# Patient Record
Sex: Female | Born: 1995 | Race: Black or African American | Hispanic: No | Marital: Single | Smoking: Never smoker
Health system: Southern US, Community
[De-identification: ages and names within clinical notes are randomized; demographics above are authoritative.]

## PROBLEM LIST (undated history)

## (undated) HISTORY — PX: ADENOIDECTOMY: SUR15

## (undated) HISTORY — PX: TONSILLECTOMY: SUR1361

---

## 2017-11-15 ENCOUNTER — Emergency Department (INDEPENDENT_AMBULATORY_CARE_PROVIDER_SITE_OTHER): Payer: Self-pay

## 2017-11-15 ENCOUNTER — Encounter: Payer: Self-pay | Admitting: *Deleted

## 2017-11-15 ENCOUNTER — Other Ambulatory Visit: Payer: Self-pay

## 2017-11-15 ENCOUNTER — Emergency Department (INDEPENDENT_AMBULATORY_CARE_PROVIDER_SITE_OTHER)
Admission: EM | Admit: 2017-11-15 | Discharge: 2017-11-15 | Disposition: A | Discharge status: Home / Self Care | Payer: Self-pay | Source: Home / Self Care | Attending: Family Medicine | Admitting: Family Medicine

## 2017-11-15 DIAGNOSIS — M79641 Pain in right hand: Secondary | ICD-10-CM

## 2017-11-15 DIAGNOSIS — M25531 Pain in right wrist: Secondary | ICD-10-CM

## 2017-11-15 DIAGNOSIS — S6391XA Sprain of unspecified part of right wrist and hand, initial encounter: Secondary | ICD-10-CM

## 2017-11-15 DIAGNOSIS — S63501A Unspecified sprain of right wrist, initial encounter: Secondary | ICD-10-CM

## 2017-11-15 NOTE — Discharge Instructions (Signed)
°  You may take 500mg  acetaminophen every 4-6 hours or in combination with ibuprofen 400-600mg  every 6-8 hours as needed for pain and inflammation.  Please [facility] if not improving by the end of the week.

## 2017-11-15 NOTE — ED Triage Notes (Signed)
Pt c/o RT pain after lifting a heavy bag at work at 1700 today. No OTC meds.

## 2017-11-15 NOTE — ED Provider Notes (Signed)
[facility]    CSN: [id number] Arrival date & time: 11/15/17  1844     History   Chief Complaint Chief Complaint  Patient presents with  . Wrist Pain    HPI Tara Goodman is a 22 y.o. female.   HPI  Tara Goodman is a 22 y.o. female presenting to UC with c/o Right hand and wrist pain that started around 5PM today while at work. Pt wrapping a heavy bag, she believes weighs about 50 pounds, and pulled up on it but immediately felt pain in her hand and wrist.  She wrapped her hand and wrist with athletic tape but no relief.  Pain is aching and sore, worse with movement and tender to the ulnar side.  She is Right hand dominant. No pain medication taken PTA. No prior injury or surgery to same hand or wrist.    History reviewed. No pertinent past medical history.  There are no active problems to display for this patient.   Past Surgical History:  Procedure Laterality Date  . ADENOIDECTOMY    . TONSILLECTOMY      OB History   None      Home Medications    Prior to Admission medications   Not on File    Family History Family History  Problem Relation Age of Onset  . Diabetes Mother   . Hypertension Mother   . Hypertension Father   . Breast cancer Maternal Grandmother     Social History Social History   Tobacco Use  . Smoking status: Never Smoker  . Smokeless tobacco: Never Used  Substance Use Topics  . Alcohol use: Never    Frequency: Never  . Drug use: Never     Allergies   Patient has no known allergies.   Review of Systems Review of Systems  Musculoskeletal: Positive for arthralgias and joint swelling. Negative for myalgias.  Skin: Negative for color change and wound.  Neurological: Positive for weakness (Right hand due to pain). Negative for numbness.     Physical Exam Triage Vital Signs ED Triage Vitals  Enc Vitals Group     BP 11/15/17 1901 122/72     Pulse Rate 11/15/17 1901 87     Resp 11/15/17 1901 16     Temp  11/15/17 1901 98.5 F (36.9 C)     Temp Source 11/15/17 1901 Oral     SpO2 11/15/17 1901 99 %     Weight 11/15/17 1902 174 lb (78.9 kg)     Height 11/15/17 1902 5\' 7"  (1.702 m)     Head Circumference --      Peak Flow --      Pain Score 11/15/17 1902 5     Pain Loc --      Pain Edu? --      Excl. in GC? --    No data found.  Updated Vital Signs BP 122/72 (BP Location: Right Arm)   Pulse 87   Temp 98.5 F (36.9 C) (Oral)   Resp 16   Ht 5\' 7"  (1.702 m)   Wt 174 lb (78.9 kg)   LMP 10/06/2017   SpO2 99%   BMI 27.25 kg/m   Visual Acuity Right Eye Distance:   Left Eye Distance:   Bilateral Distance:    Right Eye Near:   Left Eye Near:    Bilateral Near:     Physical Exam  Constitutional: She is oriented to person, place, and time. She appears well-developed and well-nourished.  HENT:  Head: Normocephalic and atraumatic.  Eyes: EOM are normal.  Neck: Normal range of motion.  Cardiovascular: Normal rate.  Pulses:      Radial pulses are 2+ on the right side.  Pulmonary/Chest: Effort normal.  Musculoskeletal: She exhibits edema and tenderness.  Right wrist and hand: tenderness along ulnar aspect. Mild edema over 5th metacarpal. Tenderness to light touch over 4th and 5th metacarpals and MCP joints. Tenderness to little finger. 4/5 grip strength compared to Left.  Limited flexion and extension of wrist. Right elbow: full ROM, non-tender.  Neurological: She is alert and oriented to person, place, and time.  Skin: Skin is warm and dry. Capillary refill takes less than 2 seconds.  Right arm: skin in tact. No ecchymosis or erythema.   Psychiatric: She has a normal mood and affect. Her behavior is normal.  Nursing note and vitals reviewed.    UC Treatments / Results  Labs (all labs ordered are listed, but only abnormal results are displayed) Labs Reviewed - No data to display  EKG None  Radiology Dg Wrist Complete Right  Result Date: 11/15/2017 CLINICAL DATA:   Right wrist pain after lifting bags at work. EXAM: RIGHT WRIST - COMPLETE 3+ VIEW COMPARISON:  None. FINDINGS: There is no evidence of fracture or dislocation. There is no evidence of arthropathy or other focal bone abnormality. Soft tissues are unremarkable. Dedicated view of the scaphoid is unremarkable. IMPRESSION: No acute osseous abnormality of the right wrist. Intact carpal bones and carpal rows. Electronically Signed   By: Tollie Eth M.D.   On: 11/15/2017 19:25   Dg Hand Complete Right  Result Date: 11/15/2017 CLINICAL DATA:  Right hand pain after lifting bags at work. Burning sensation radiating to the arm. EXAM: RIGHT HAND - COMPLETE 3+ VIEW COMPARISON:  None. FINDINGS: There is no evidence of fracture or dislocation. There is no evidence of arthropathy or other focal bone abnormality. Soft tissues are unremarkable. IMPRESSION: No acute osseous abnormality of the right hand and included wrist. Electronically Signed   By: Tollie Eth M.D.   On: 11/15/2017 19:25    Procedures Procedures (including critical care time)  Medications Ordered in UC Medications - No data to display  Initial Impression / Assessment and Plan / UC Course  I have reviewed the triage vital signs and the nursing notes.  Pertinent labs & imaging results that were available during my care of the patient were reviewed by me and considered in my medical decision making (see chart for details).      Discussed imaging with pt. Will tx as sprain Pt declined Ace wrap.  Pt info packet provided Pt may return to work tomorrow, light duty with limited pushing, pulling, lifting with Right hand for 3 days.   Final Clinical Impressions(s) / UC Diagnoses   Final diagnoses:  Right hand pain  Sprain of right wrist, initial encounter  Hand sprain, right, initial encounter     Discharge Instructions      You may take 500mg  acetaminophen every 4-6 hours or in combination with ibuprofen 400-600mg  every 6-8 hours as  needed for pain and inflammation.  Please follow up with employee health at [facility] if not improving by the end of the week.      ED Prescriptions    None     Controlled Substance Prescriptions [place] Controlled Substance Registry consulted? Not Applicable   Rolla Plate 11/15/17 1935

## 2019-12-26 IMAGING — DX DG WRIST COMPLETE 3+V*R*
4 series · 4 of 4 positions shown · non-contrast
Comparison: None.

CLINICAL DATA: Right wrist pain after lifting bags at work.

EXAM:
RIGHT WRIST - COMPLETE 3+ VIEW

[wrist pa]
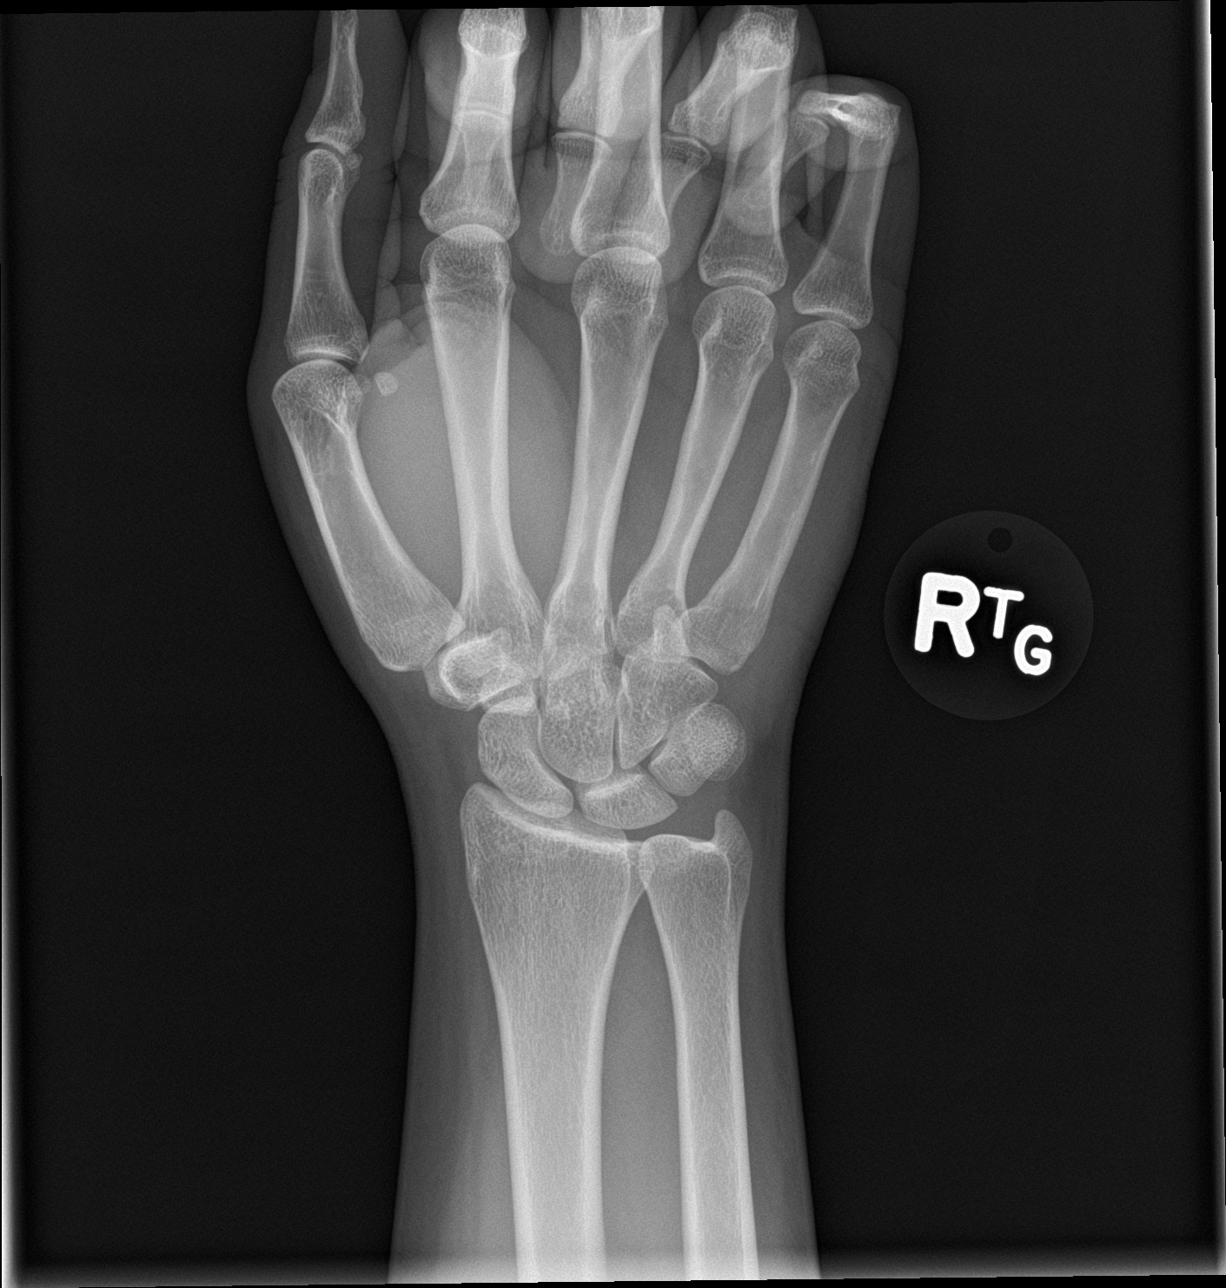

[wrist obl]
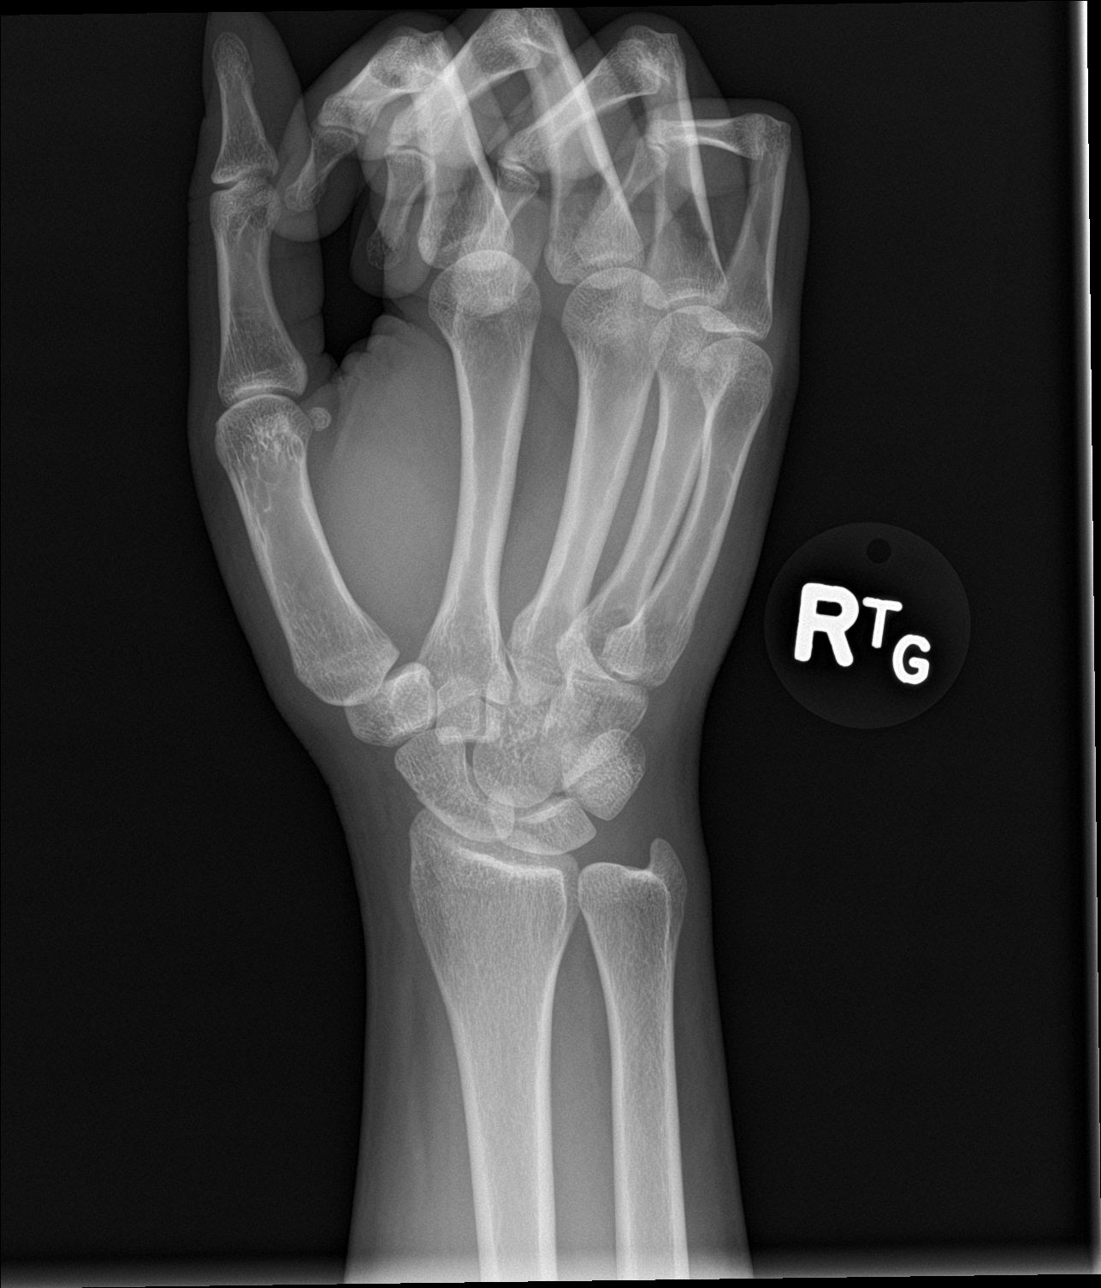

[wrist lat]
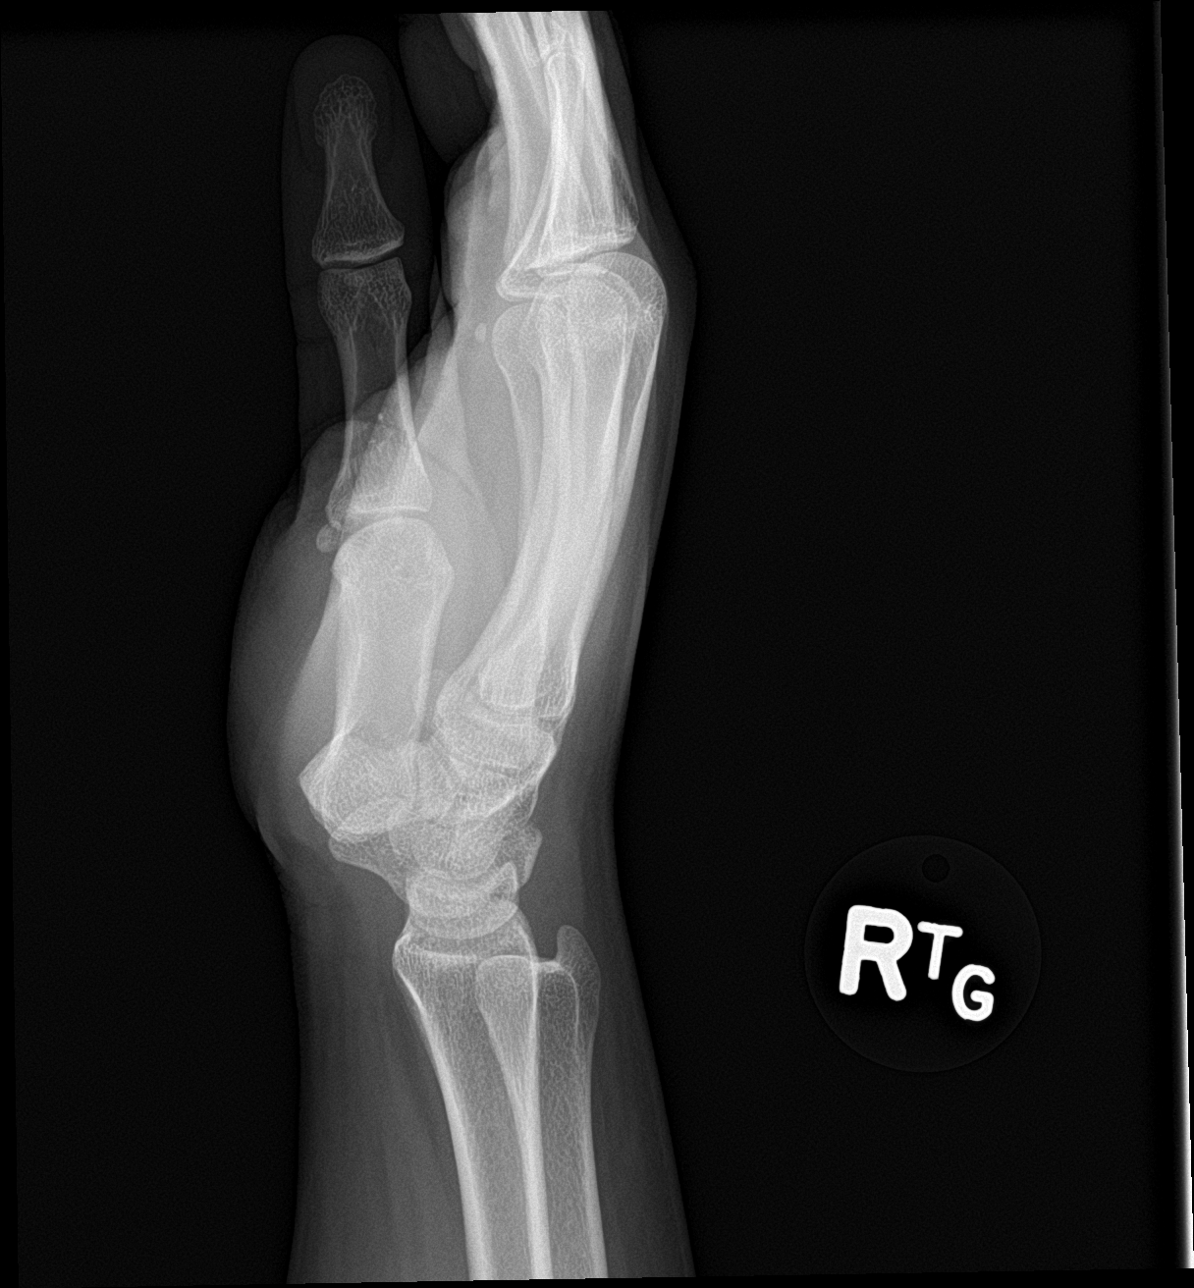

[wrist navicular]
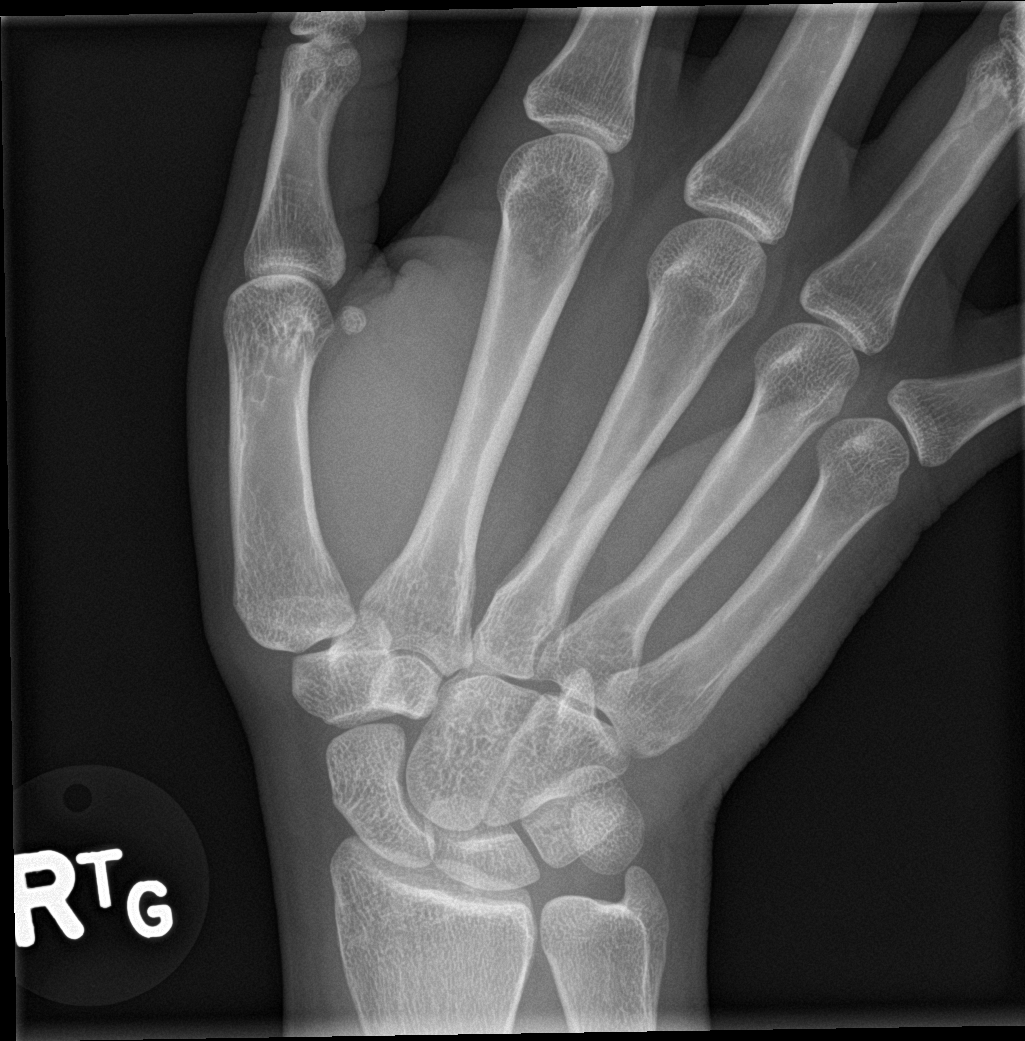

[4 of 4 positions shown; findings below may reference images not displayed]

FINDINGS: There is no evidence of fracture or dislocation. There is no
evidence of arthropathy or other focal bone abnormality. Soft
tissues are unremarkable. Dedicated view of the scaphoid is
unremarkable.
IMPRESSION: No acute osseous abnormality of the right wrist. Intact carpal bones
and carpal rows.
